# Patient Record
Sex: Male | Born: 1955 | Hispanic: Yes | Marital: Married | Smoking: Former smoker
Health system: Southern US, Community
[De-identification: ages and names within clinical notes are randomized; demographics above are authoritative.]

## PROBLEM LIST (undated history)

## (undated) DIAGNOSIS — I340 Nonrheumatic mitral (valve) insufficiency: Secondary | ICD-10-CM

## (undated) DIAGNOSIS — I10 Essential (primary) hypertension: Secondary | ICD-10-CM

## (undated) DIAGNOSIS — E7800 Pure hypercholesterolemia, unspecified: Secondary | ICD-10-CM

## (undated) DIAGNOSIS — I509 Heart failure, unspecified: Secondary | ICD-10-CM

---

## 2004-11-10 ENCOUNTER — Emergency Department: Payer: Self-pay | Admitting: Emergency Medicine

## 2006-10-11 ENCOUNTER — Emergency Department: Payer: Self-pay | Admitting: Emergency Medicine

## 2006-12-29 ENCOUNTER — Emergency Department: Payer: Self-pay | Admitting: Emergency Medicine

## 2008-11-01 ENCOUNTER — Emergency Department: Payer: Self-pay | Admitting: Emergency Medicine

## 2010-10-27 ENCOUNTER — Emergency Department: Payer: Self-pay | Admitting: Unknown Physician Specialty

## 2012-12-29 ENCOUNTER — Observation Stay: Payer: Self-pay | Admitting: Internal Medicine

## 2012-12-29 LAB — URINALYSIS, COMPLETE
Bacteria: NONE SEEN
Bilirubin,UR: NEGATIVE
Blood: NEGATIVE
Glucose,UR: NEGATIVE mg/dL (ref 0–75)
Ketone: NEGATIVE
Nitrite: NEGATIVE
Ph: 8 (ref 4.5–8.0)
Squamous Epithelial: NONE SEEN
WBC UR: 2 /HPF (ref 0–5)

## 2012-12-29 LAB — CK-MB: CK-MB: 0.9 ng/mL

## 2012-12-29 LAB — COMPREHENSIVE METABOLIC PANEL
Anion Gap: 7 (ref 7–16)
Bilirubin,Total: 0.6 mg/dL (ref 0.2–1.0)
Chloride: 109 mmol/L — ABNORMAL HIGH (ref 98–107)
EGFR (Non-African Amer.): >60
Osmolality: 282 (ref 275–301)
SGPT (ALT): 34 U/L (ref 12–78)
Sodium: 139 mmol/L (ref 136–145)

## 2012-12-29 LAB — CBC
HCT: 41.1 % (ref 40.0–52.0)
HGB: 14.1 g/dL (ref 13.0–18.0)
MCHC: 34.2 g/dL (ref 32.0–36.0)
Platelet: 199 10*3/uL (ref 150–440)
RBC: 4.66 10*6/uL (ref 4.40–5.90)
RDW: 13.3 % (ref 11.5–14.5)

## 2012-12-29 LAB — TROPONIN I
Troponin-I: <0.02 ng/mL
Troponin-I: <0.02 ng/mL

## 2012-12-29 LAB — HEMOGLOBIN A1C: Hemoglobin A1C: 5.8 %

## 2012-12-30 LAB — COMPREHENSIVE METABOLIC PANEL
Albumin: 3.2 g/dL — ABNORMAL LOW (ref 3.4–5.0)
Alkaline Phosphatase: 111 U/L (ref 50–136)
Bilirubin,Total: 0.4 mg/dL (ref 0.2–1.0)
Chloride: 109 mmol/L — ABNORMAL HIGH (ref 98–107)
Creatinine: 1 mg/dL (ref 0.60–1.30)
EGFR (African American): >60
EGFR (Non-African Amer.): >60
Glucose: 152 mg/dL — ABNORMAL HIGH (ref 65–99)
SGPT (ALT): 32 U/L (ref 12–78)
Sodium: 139 mmol/L (ref 136–145)

## 2012-12-30 LAB — CBC WITH DIFFERENTIAL/PLATELET
Basophil %: 0.1 %
Eosinophil #: 0 10*3/uL (ref 0.0–0.7)
Eosinophil %: 0 %
HCT: 42.5 % (ref 40.0–52.0)
Lymphocyte #: 0.6 10*3/uL — ABNORMAL LOW (ref 1.0–3.6)
Lymphocyte %: 7.8 %
MCH: 29.4 pg (ref 26.0–34.0)
MCV: 89 fL (ref 80–100)
Monocyte #: 0.1 x10 3/mm — ABNORMAL LOW (ref 0.2–1.0)
Neutrophil #: 6.6 10*3/uL — ABNORMAL HIGH (ref 1.4–6.5)
RBC: 4.75 10*6/uL (ref 4.40–5.90)
WBC: 7.3 10*3/uL (ref 3.8–10.6)

## 2013-12-20 IMAGING — US US CAROTID DUPLEX BILAT
1 series · 14 of 24 positions shown · non-contrast
Comparison: none

REASON FOR EXAM: presyncope
COMMENTS:

[Series 1: us carotid duplex bilat · 0.08mm/px · 14 of 72 slices shown]
[im 1/72]
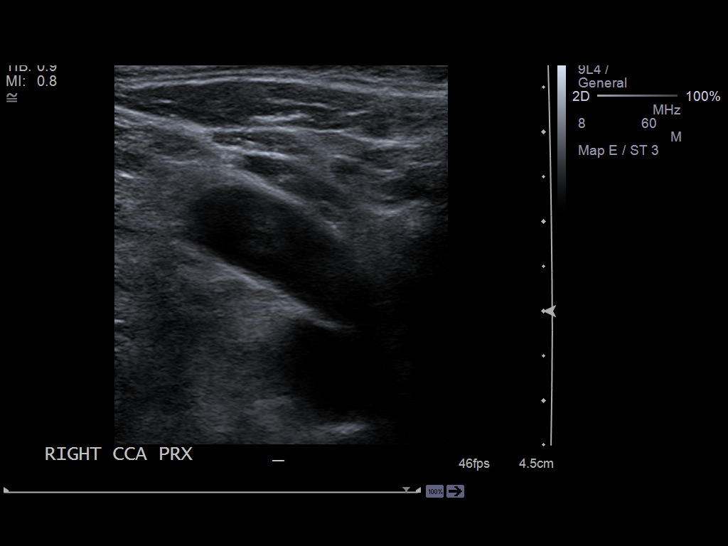
[im 7/72]
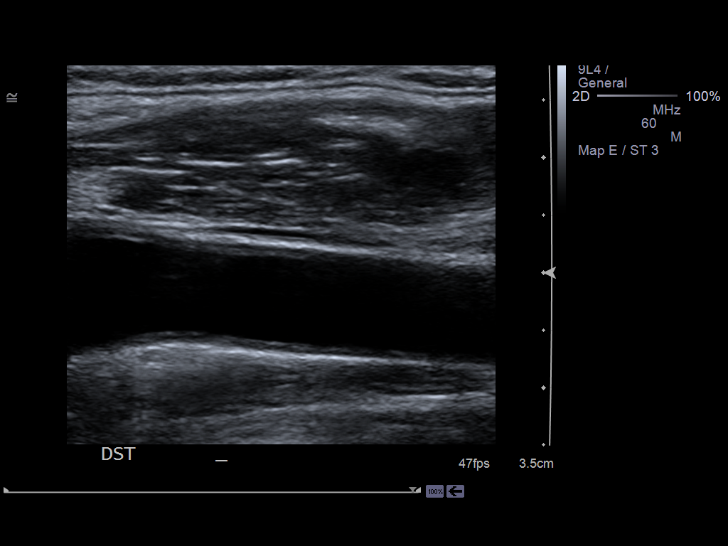
[im 13/72]
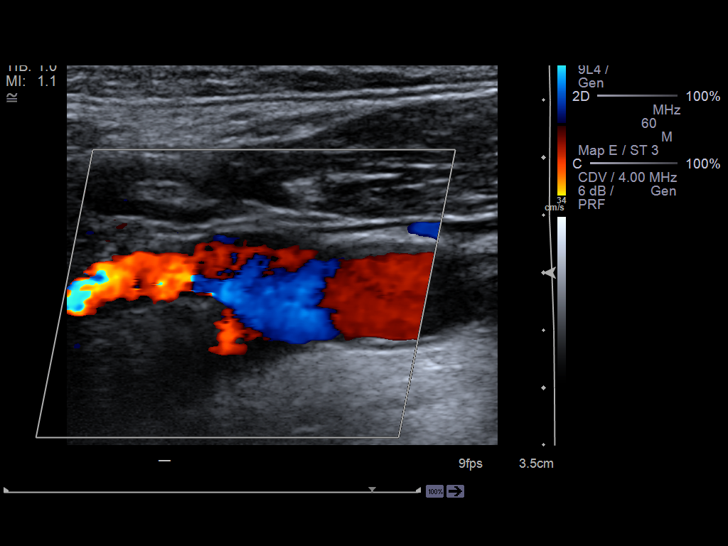
[im 19/72]
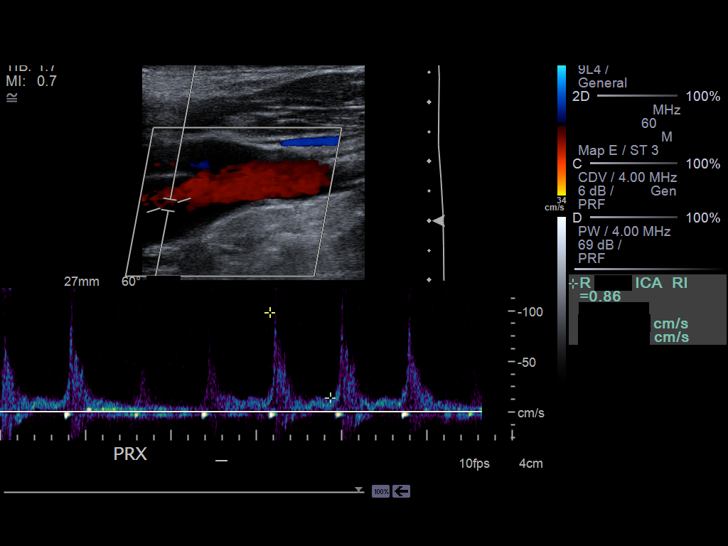
[im 22/72]
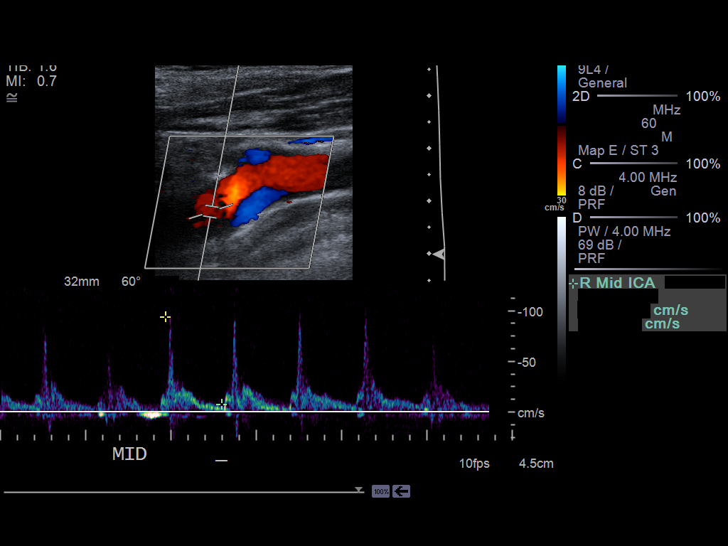
[im 28/72]
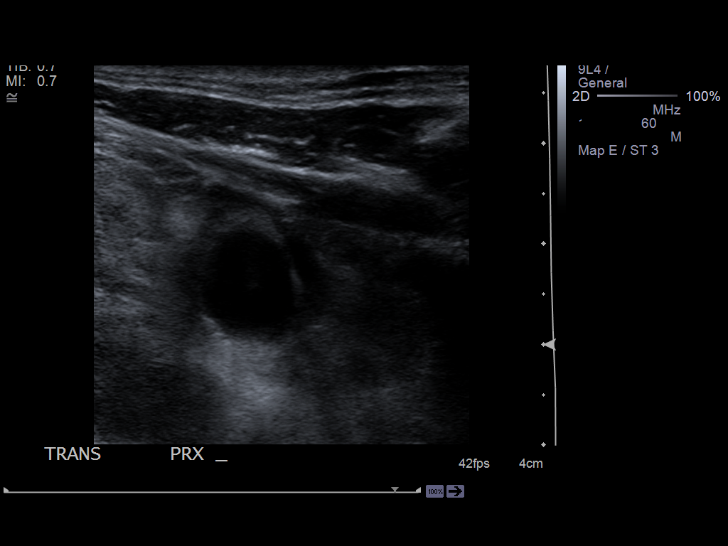
[im 34/72]
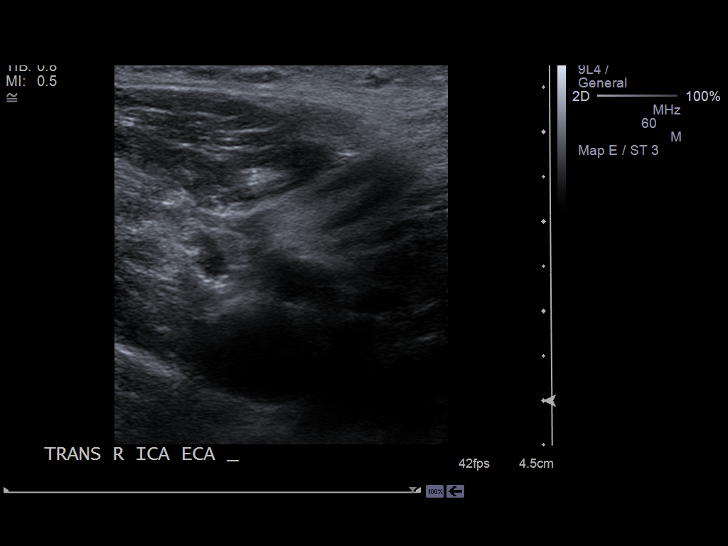
[im 38/72]
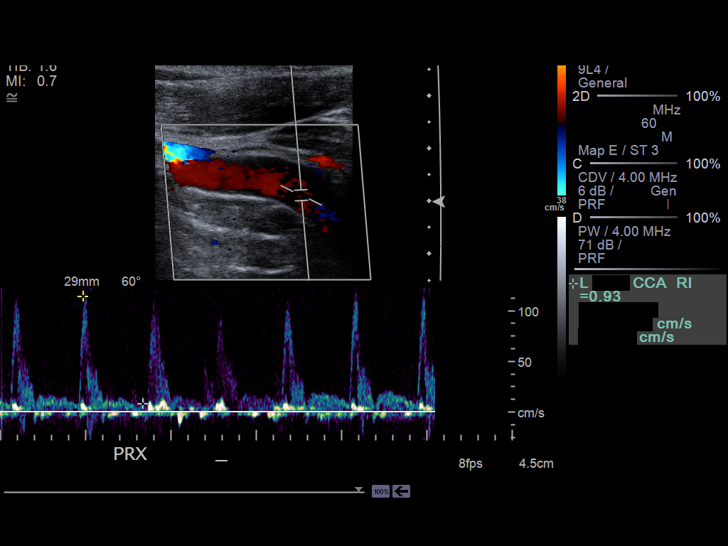
[im 44/72]
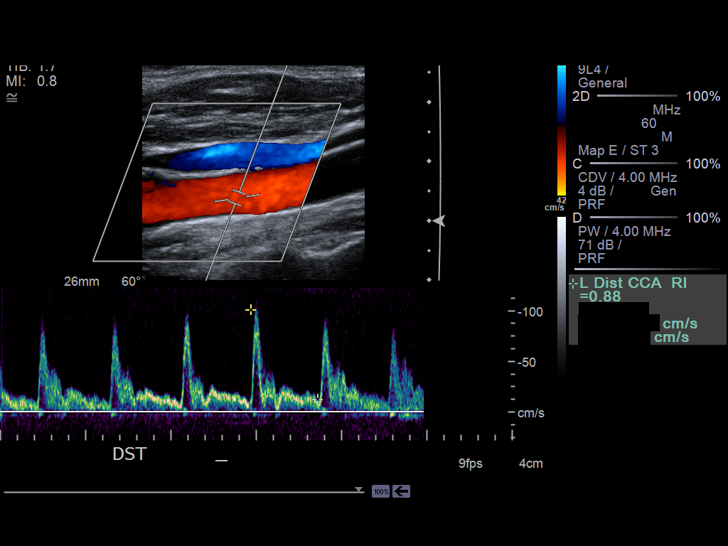
[im 50/72]
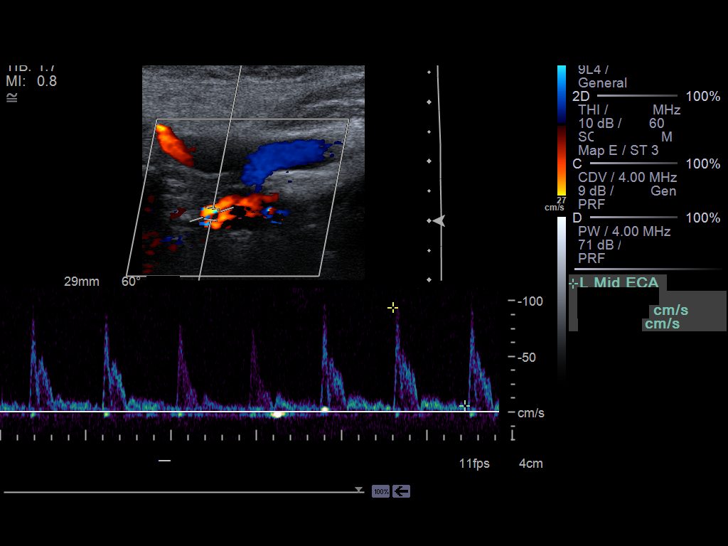
[im 56/72]
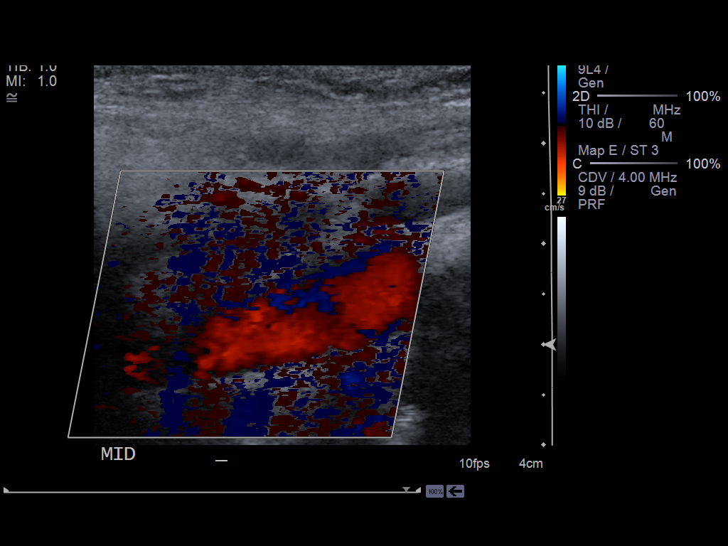
[im 59/72]
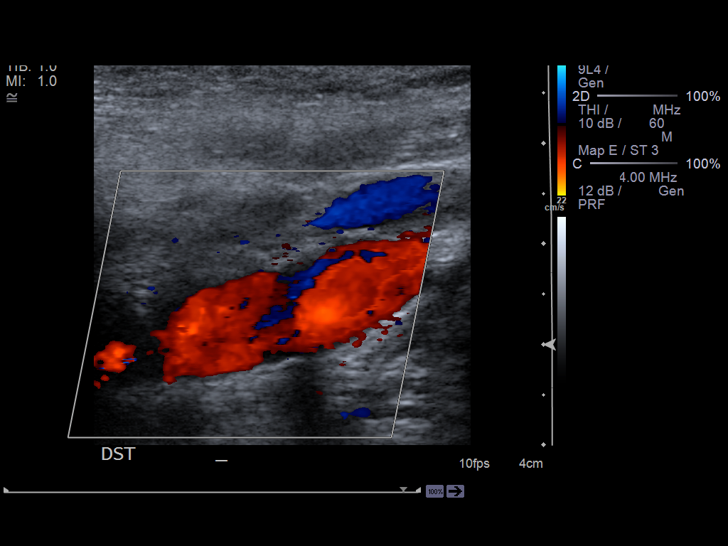
[im 65/72]
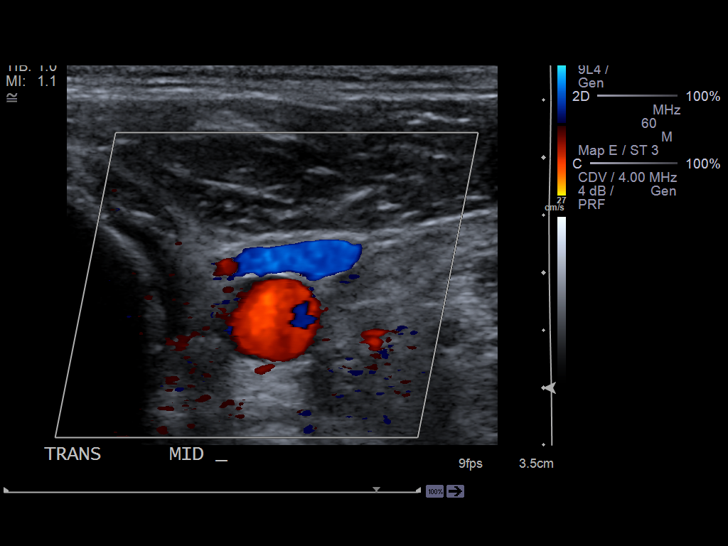
[im 72/72]
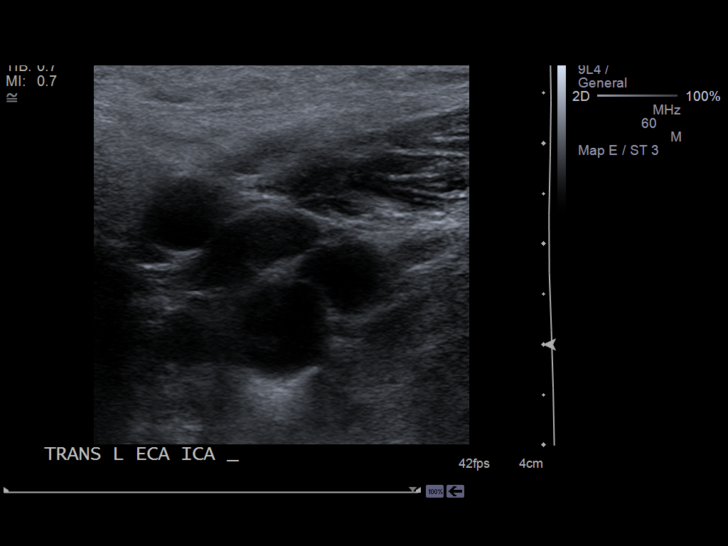

[14 of 24 positions shown; findings below may reference images not displayed]

PROCEDURE:     US  - US CAROTID DOPPLER BILATERAL  - December 29, 2012  [DATE]

RESULT:     Grayscale and color flow Doppler techniques were employed to
evaluate the cervical carotid system. Neither the right nor left carotid
systems demonstrates significant turbulence within the vessels. There is a
small amount of calcified plaque at the carotid bulb on the left. The
waveform patterns appear normal.

On the right the peak internal carotid systolic velocity measured 98 cm/sec
and the peak common carotid velocity measured 77 cm/sec corresponding to a
normal ratio of 1.3. On the left the peak internal carotid systolic velocity
measured 55 cm/sec and the common carotid velocity measured 98 cm/sec
corresponding to a normal ratio of 0.6. The vertebral arteries are normal in
flow direction bilaterally.
IMPRESSION: There is no evidence of a hemodynamically significant
carotid stenosis.

[REDACTED]

## 2013-12-20 IMAGING — CT CT HEAD WITHOUT CONTRAST
1 series · 16 of 30 positions shown, 20 images · non-contrast
Comparison: none

REASON FOR EXAM: CVA
COMMENTS:   May transport without cardiac monitor

[Series 2: soft tissue · axial · 0.42mm/px · z∈[-114,+26]mm · 16 of 32 slices shown, 20 images]
[im 2/32  brain]
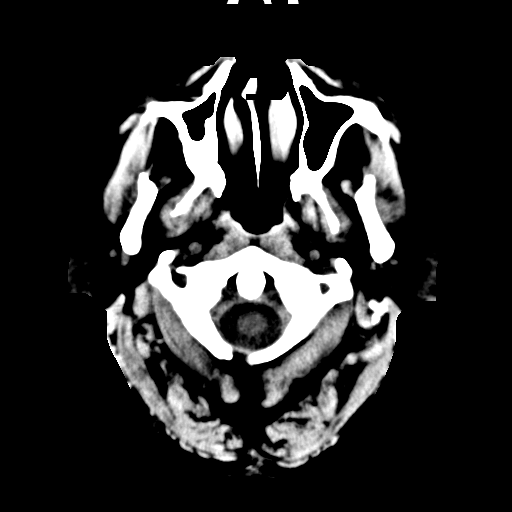
[im 2/32  bone]
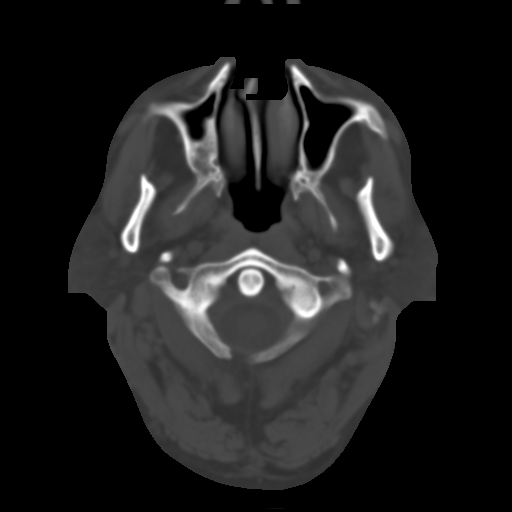
[im 4/32  brain]
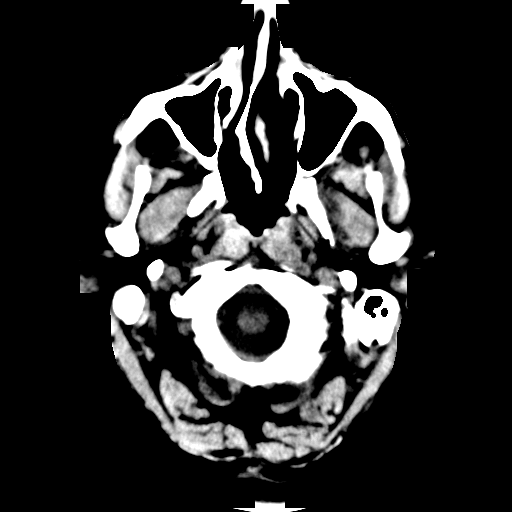
[im 6/32  brain]
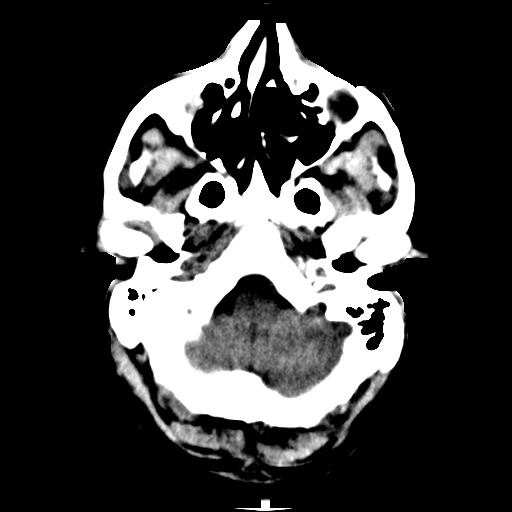
[im 8/32  brain]
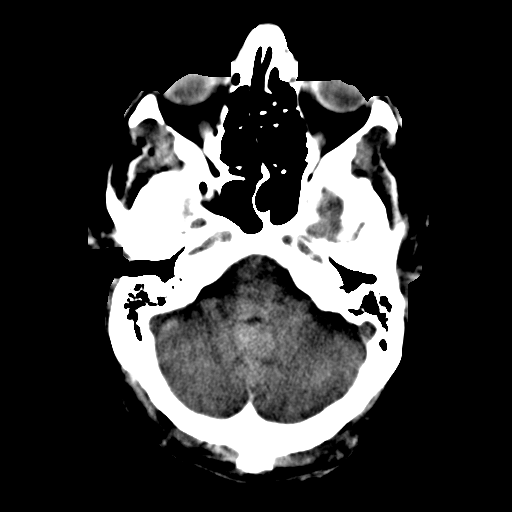
[im 9/32  brain]
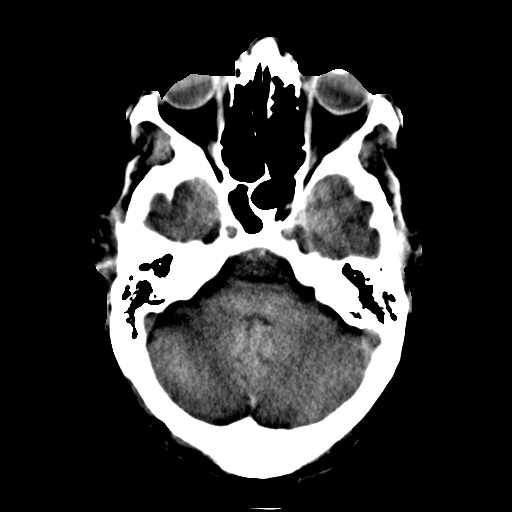
[im 9/32  bone]
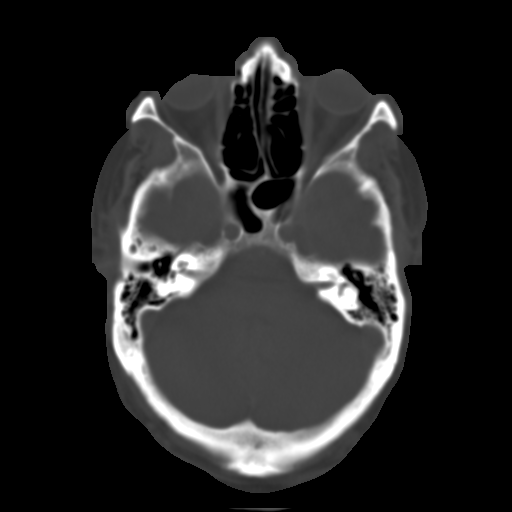
[im 11/32  brain]
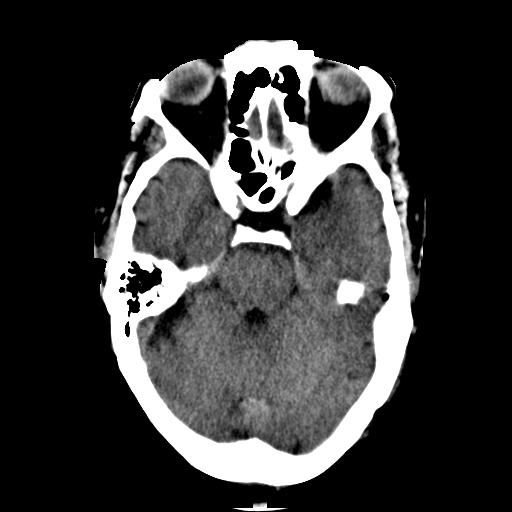
[im 13/32  brain]
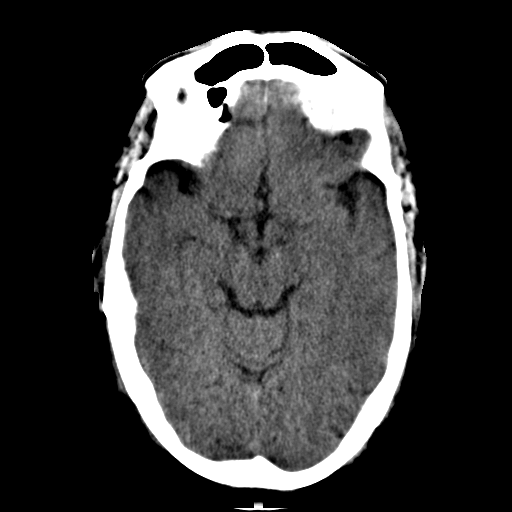
[im 15/32  brain]
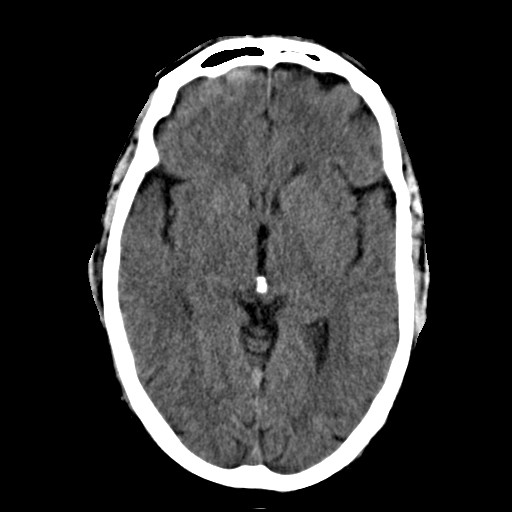
[im 17/32  brain]
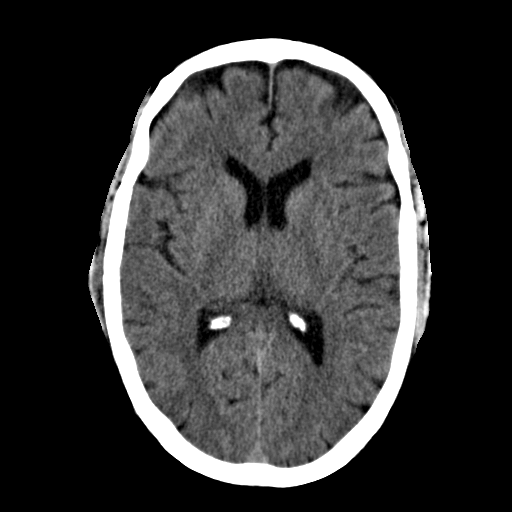
[im 17/32  bone]
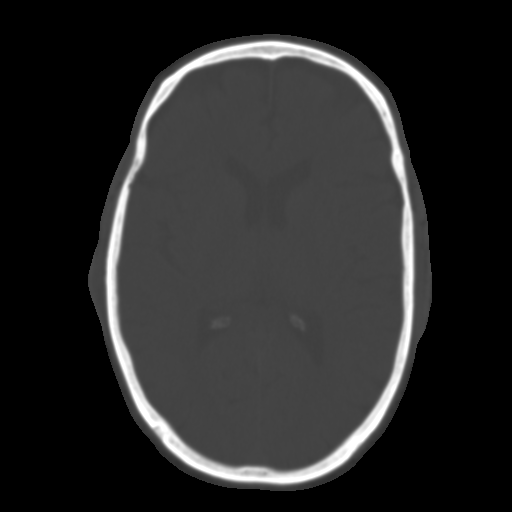
[im 19/32  brain]
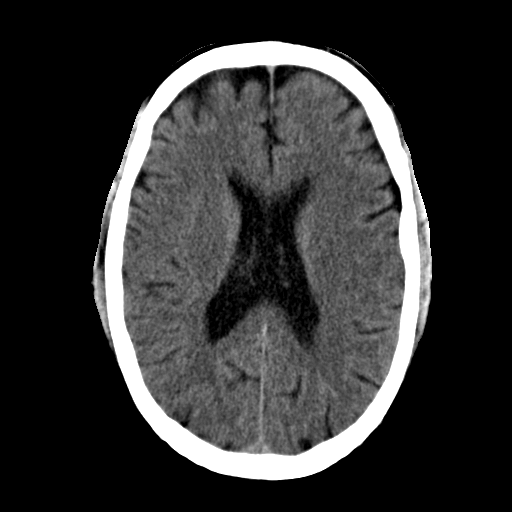
[im 21/32  brain]
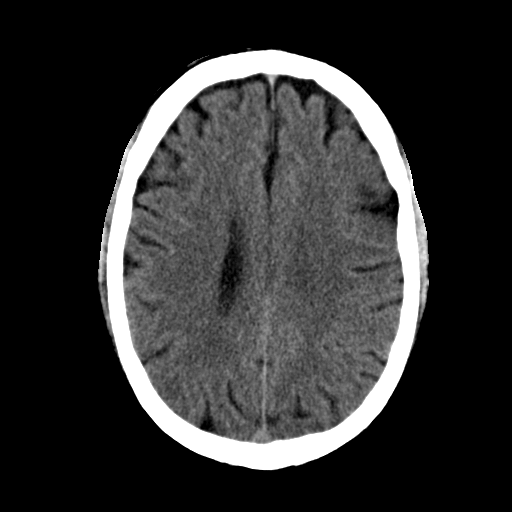
[im 23/32  brain]
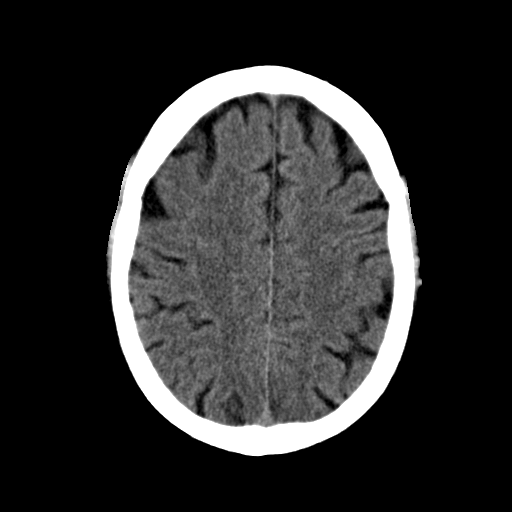
[im 24/32  brain]
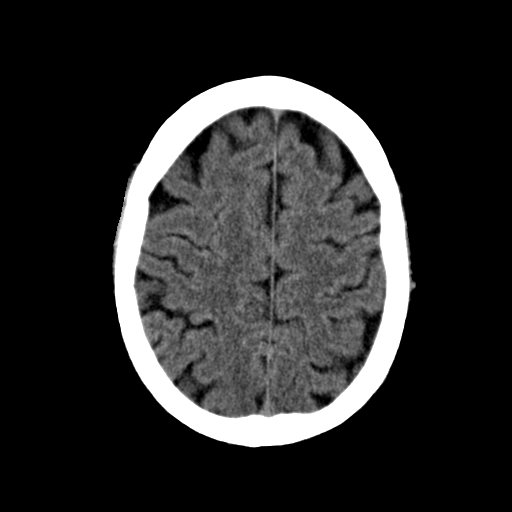
[im 24/32  bone]
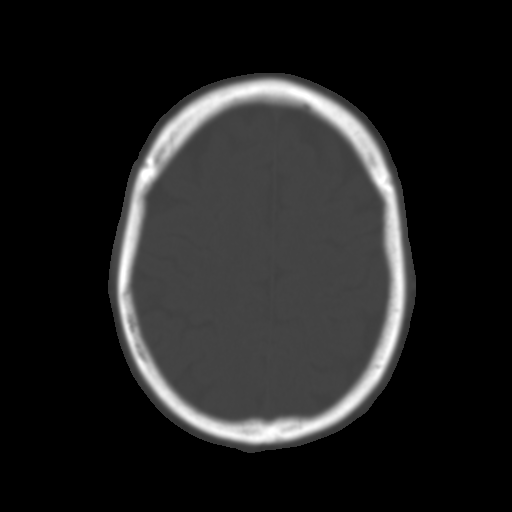
[im 26/32  brain]
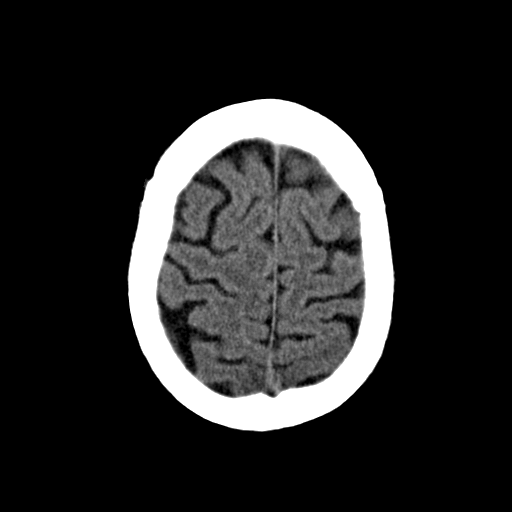
[im 28/32  brain]
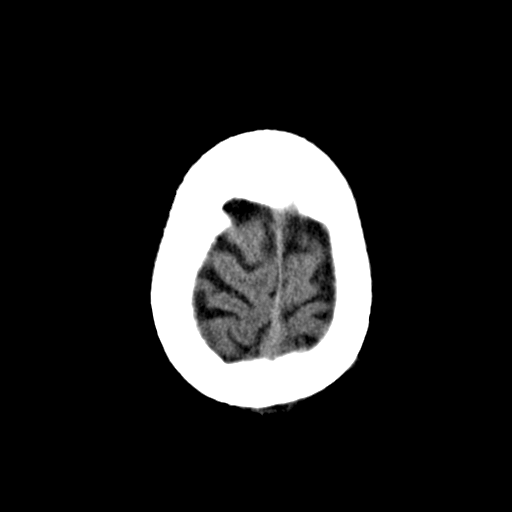
[im 30/32  brain]
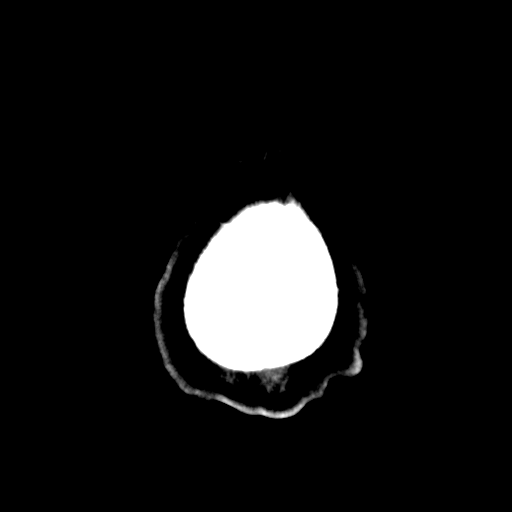

[16 of 30 positions shown; findings below may reference images not displayed]

PROCEDURE:     CT  - CT HEAD WITHOUT CONTRAST  - December 29, 2012  [DATE]

RESULT:     Axial noncontrast CT scanning was performed through the brain
with reconstructions at 5 mm intervals and slice thicknesses.

The ventricles are normal in size and position. There is no intracranial
hemorrhage nor intracranial mass effect. There is no evidence of an evolving
ischemic infarction. The cerebellum and brainstem are normal in density. At
bone window settings the observed portions of the paranasal sinuses and
mastoid air cells are clear. There is no evidence of an acute skull fracture.
IMPRESSION: Normal noncontrast CT scan of the brain for age.

[REDACTED]

## 2015-02-28 NOTE — H&P (Signed)
DATE OF BIRTH:  1966-07-07  DATE OF ADMISSION:  12/29/2012  PRIMARY CARE PHYSICIAN:  Dr. Illene RegulusSelvidge  HISTORY OF PRESENT ILLNESS:  The patient is a 59 year old Spanish male with past medical history significant for history of hypertension, hyperlipidemia, coronary artery disease, no history of diabetes mellitus, who presented to the hospital with complaints of dizziness episode. According to patient's wife and the patient himself, he woke up early in the morning at around 1:30 a.m. with complaints of significant dizziness. He was falling back, he was not able to walk, he was not able even to stand up. He complained of the room spinning around. He was brought to the Emergency Room because he was not improving over a period of a few hours, and was given some meclizine. He also had a CT scan of his head done, which did not show any significant abnormalities, but because of no significant improvement over a period of time, a decision was made to admit patient for observation. Patient also was complaining of shortness of breath. He stated that he was not able to breathe; however, his oxygen saturations were normal. He seems to be a little bit better now since meclizine was given, as well as he is placed on oxygen. He is also complaining of intermittent chest pains, but denies any significant chest pains earlier today. Admits to feeling presyncopal earlier today, and feeling weak. He admits to having some blurring of his vision. The patient's wife tells me that he has been snoring for a while now, and he has significant sleepiness in daytime.   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 In 2002 was on the left side of the heart and in 2004 was on the right side.   MEDICATIONS: Aspirin 81 mg p.o. daily, Vasotec 5 mg p.o. daily, famotidine 10 mg p.o. twice daily, Lipitor  80 mg p.o. daily, Toprol-XL 12.5 mg p.o. twice daily, and vitamin B12 unknown dose once a day.   ALLERGIES:  None.   PAST SURGICAL HISTORY:  Right carpal tunnel release in 1996. 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   FAMILY HISTORY: Negative for diabetes, coronary artery disease or cancer, except for patient's sister had breast cancer.   SOCIAL HISTORY: The patient is married and has 8 children. Used to smoke less than 1 pack per day for more than 20 years, quit approximately 20 years ago. No alcohol abuse. He is working at [employer] [place] [employer]    REVIEW OF SYSTEMS:  GENERAL: Positive for feeling weak. Some blurring of vision. Some snoring as well as sleepiness in daytime. Very short of breath earlier today, as well as having intermittent chest pains, especially whenever he runs around. He has [facility]. Admits to feeling presyncopal today. Also he had most recent stress test in 2006 by Dr. Gwen PoundsKowalski. Otherwise denies any fevers, chills, fatigue, pains, weight loss or gain.  EYES:  Denies any double vision, glaucoma or cataracts.  EARS, NOSE, THROAT: Denies any tinnitus, allergies, epistaxis, sinus pain, dentures, difficulty swallowing. RESPIRATORY:  Denies any cough, wheeze, asthma, COPD.   CARDIOVASCULAR: Denies any orthopnea, edema, arrhythmias. GASTROINTESTINAL:  Denies nausea, vomiting, diarrhea or constipation.  GENITOURINARY: Denies dysuria, hematuria, frequency,  incontinence.  ENDOCRINOLOGY: Denies any polydipsia, nocturia, thyroid problems, heat or cold intolerance  or thirst.  HEMATOLOGIC:  Denies anemia, easy bruising, bleeding, swollen glands.  SKIN:  Denies acne, rashes, lesions or moles.  MUSCULOSKELETAL:  Denies arthritis, cramps, swelling.  NEUROLOGIC:  No numbness, epilepsy or tremor.  PSYCHIATRIC:  Denies anxiety, insomnia or depression.   PHYSICAL EXAMINATION: VITAL SIGNS:  On arrival to the hospital, temperature was 97.7, pulse 61, respiratory rate 18, blood pressure 134/80, saturation was 99% on 3 liters of oxygen via nasal cannula. On arrival to the Emergency Room; however, patient's vital signs were temperature 98.1, pulse was 70, respiratory rate was 20, blood pressure 115/71, saturation 98% on room air.  GENERAL: This is a well-nourished Spanish male in no significant distress, comfortable on stretcher.  HEENT: Pupils are equal and reactive to light. Extraocular muscles intact. There is no conjunctivitis. There is normal hearing. No pharyngeal erythema. Mucosa is moist.  NECK: No masses. Supple, nontender. Thyroid not enlarged. No adenopathy. No JVD or carotid bruits bilaterally. Full range of motion.  LUNGS: Clear to auscultation in all fields. A few rhonchi were heard bilaterally, more on the left side posteriorly. No diminished breath sounds or wheezing. No labored inspirations or increased effort. No dullness to percussion. No overt respiratory distress noted.  CARDIOVASCULAR: S1, S2 appreciated. No murmurs, gallops or rubs noted. PMI not lateralized.  CHEST:  Nontender to palpation.  EXTREMITIES:  Have 1+ pedal pulses. No lower extremity edema, calf tenderness or cyanosis was noted.  ABDOMEN: Soft, nontender. Bowel sounds were present. No hepatosplenomegaly or masses were noted.  RECTAL:  Deferred.  MUSCULOSKELETAL:  Able to move all extremities. No cyanosis, degenerative joint disease or kyphosis . Gait not tested.  SKIN:  Did not reveal any rashes, lesions, erythema, nodularity or induration. It was warm and dry to palpation.  LYMPHATIC: No adenopathy in the cervical region.  NEUROLOGIC:  Cranial nerves grossly intact. Sensory is intact. No dysarthria or aphasia.  PSYCHIATRIC: The patient is alert and oriented to time, person and place, cooperative. Memory is good. No recent confusion, agitation or depression noted.   LABORATORY DATA:  BMP showed glucose  129, BUN of 19, otherwise unremarkable BMP. The patient's liver enzymes were normal. Cardiac enzymes:  First set negative. The patient's CBC within normal limits. The patient's urinalysis:  Yellow clear urine, negative for glucose, bilirubin or ketones, specific gravity 1.023, pH was 8.0, negative for blood, protein, nitrites or leukocyte esterase, less than 1 red blood cell,  2 white blood cells, no bacteria or epithelial cells were noted. Mucus was present.   RADIOLOGIC STUDIES: Chest x-ray portable single view on 12/29/2012: No acute cardiopulmonary disease. CT scan of head without contrast 12/29/2012 revealed normal noncontrast CT scan of the brain. MRI of brain as well as MRA of brain without contrast showed no acute intracranial pathology.   ASSESSMENT AND PLAN: 1.  Dizziness, very likely vertigo. Admit patient to medical floor for observation. Very likely peripheral. Will start patient on meclizine, also add Valium as needed, and steroid taper. Will get physical therapist involved. No cerebrovascular accident on MRI. However, could not rule out transient ischemic attack. Will continue aspirin therapy for now.  2.  Presyncope. Will get orthostatics. Will get also carotid ultrasound as well as echocardiogram. 3.  Dyspnea. Get D-dimer and CT scan of chest  if D-dimer is elevated.  4.  Hyperglycemia. Check hemoglobin A1c.  5.  History of hypertension. Continue outpatient medications.  6.  History of hyperlipidemia. Continue outpatient medications. Get a lipid panel in the morning.  7.  History of coronary artery disease. Check cardiac enzymes x 3.   TIME SPENT: 50 minutes.    ____________________________ Katharina Caper, MD rv:mr D: 12/29/2012 16:39:04 ET T: 12/29/2012 19:05:25 ET JOB#: [id number]  cc: Katharina Caper, MD, <Dictator> Karrie Doffing, MD  Gauge Winski MD ELECTRONICALLY SIGNED 12/31/2012 18:49

## 2015-02-28 NOTE — Discharge Summary (Signed)
PATIENT NAME:  Howard Patel, Howard Patel MR#:  [id number] DATE OF BIRTH:  [birth year]  DATE OF ADMISSION:  12/29/2012 DATE OF DISCHARGE:  12/30/2012  ADMITTING DIAGNOSIS: Dizziness, likely vertigo.   DISCHARGE DIAGNOSES:  1. Vertigo, resolving.  2. Syncope, likely due to vertigo.  3. Dyspnea, possibly anxiety related. 4. Intermittent chest pains, negative Myoview, likely noncardiac, exact diagnosis is unclear.  5. Hyperglycemia with hemoglobin A1c 5.8. 6. History of hypertension.  7. Hyperlipidemia.  8. Coronary artery disease.   DISCHARGE CONDITION: Stable.   DISCHARGE MEDICATIONS: The patient is to resume his outpatient medications which are:  1. Famotidine 10 mg p.o. twice daily as needed.  2. Aspirin 81 mg p.o. daily.  3. Enalapril 5 mg p.o. at bedtime.  4. Lipitor 80 mg p.o. at bedtime.  5. Toprol-XL 25 mg, 1/2 tablet twice daily.  6. Vitamin B12 unknown dose once daily.  7. Meclizine 25 mg 3 times daily. This is a new medication. 8. Prednisone 10 mg tablets, 3 tablets once on 12/31/2012, then taper x 10 mg daily until stopped. Zofran oral disintegrating tablet, 4 mg sublingually 3 times daily as needed.   HOME OXYGEN: None.   DIET: 2 grams salt, low fat, low cholesterol, regular consistency.   ACTIVITY LIMITATIONS: As tolerated.   FOLLOW-UP APPOINTMENT: With Dr. Rinaldo CloudSelvage in 2 days after discharge.  CONSULTANTS: Care management.   LABORATORY AND RADIOLOGICAL DATA:  Chest, portable single view 12/29/2012 showed no acute cardiopulmonary disease. CT scan of head without contrast 12/29/2012 revealed normal noncontrast CT of brain. MRI of brain with and without contrast as well as MRA of brain without contrast on 12/29/2012 showed no acute intracranial pathology. Doppler ultrasound of carotid arteries on 12/29/2012 showed no evidence of hemodynamically significant carotid stenosis. Echocardiogram results are pending. Echocardiogram was not performed during the patient's stay.  Myoview  stress test done and read by Dr. Juliann Paresallwood was negative according to telephone conversation with  Dr. Juliann Paresallwood.    Lab data done in the Emergency Room showed glucose of 139, BUN of 19, otherwise BMP was unremarkable. The patient's liver enzymes were normal. Cardiac enzymes, 1st set as well as subsequent 2 more sets were within normal limits. TSH was normal at 1.58. The patient's CBC was within normal limits. Urinalysis was unremarkable. EKG was normal.   HISTORY AND PHYSICAL:  The patient is a 59 year old Spanish male with past medical history significant for history of hypertension, coronary artery disease, hyperlipidemia, who presented to the hospital with complaints of dizziness, lightheadedness and feeling that the room was spinning around. He was not able to stand up or walk.  He presented to the Emergency Room, and because of concern of not improving symptoms hospitalist services were contacted for admission to rule out stroke.   On arrival to the Emergency Room, temperature was 97.7, pulse was 61, respiratory rate was 18, blood pressure 134/80, saturation was 99% on 3 liters of oxygen through nasal cannula. In the Emergency Room, his oxygen saturation was 98% on room air. Physical exam was unremarkable.    HOSPITAL COURSE:  The patient was admitted to the hospital. His cardiac enzymes were cycled.  He underwent MRI of his brain to rule out stroke, and that was negative for any stroke. The patient was initiated on meclizine as well as valium, as needed, and prednisone taper for suspected vestibulopathy, peripheral vertigo. With this therapy he improved. and by the day of discharge, 12/30/2012, he was doing well. He was able to ambulate without any help, and  he did not complain of any significant discomfort or shortness of breath or any other abnormality.    On the day of discharge, 12/30/2012, his vital signs were temperature 97.7%, pulse was 60, respiratory rate was 18 to 20, blood pressure 111 to  115 systolic and 60s to 70s diastolic. Oxygen saturation was 97% on room air at rest as well as on exertion. The patient's orthostatic vital signs were checked as he was complaining of some lightheadedness and dizziness, and his orthostatics were completely within normal limits. The patient underwent stress test of his heart due to his complaints of intermittent chest pains, and that was negative for inducible ischemia. It was felt that the patient is stable to be discharged home with current medications. He is to follow up with his primary care physician for further recommendations.   In regards to his dyspnea, it was unclear why he would have episodes of dyspnea, but it was postulated that this could have been anxiety-related as we did not find anything to suggest any lung or heart disease at this point.   The patient was noted to be hyperglycemic on arrival to the hospital. Hemoglobin A1c was checked and was found to be normal at 5.8. No diabetes.   For history of hypertension, hyperlipidemia, as well as history of coronary artery disease, the patient is to continue his outpatient management.   For his vertigo, he is to continue meclizine, as well as prednisone taper, as well as Zofran as needed.  As mentioned above, the patient is being discharged in stable condition with the above-mentioned medications and follow-up.   TIME SPENT: 40 minutes.  ____________________________ Katharina Caper, MD rv:cb D: 12/30/2012 18:30:49 ET T: 12/31/2012 07:54:00 ET JOB#: [id number]  cc: Katharina Caper, MD, <Dictator> Kylil Swopes MD ELECTRONICALLY SIGNED 12/31/2012 18:49

## 2023-09-14 ENCOUNTER — Emergency Department: Payer: Medicare Other

## 2023-09-14 ENCOUNTER — Encounter: Payer: Self-pay | Admitting: Intensive Care

## 2023-09-14 ENCOUNTER — Other Ambulatory Visit: Payer: Self-pay

## 2023-09-14 ENCOUNTER — Emergency Department
Admission: EM | Admit: 2023-09-14 | Discharge: 2023-09-14 | Disposition: A | Discharge status: Home / Self Care | Payer: Medicare Other | Attending: Emergency Medicine | Admitting: Emergency Medicine

## 2023-09-14 DIAGNOSIS — M549 Dorsalgia, unspecified: Secondary | ICD-10-CM | POA: Diagnosis present

## 2023-09-14 DIAGNOSIS — I509 Heart failure, unspecified: Secondary | ICD-10-CM | POA: Diagnosis not present

## 2023-09-14 DIAGNOSIS — I110 Hypertensive heart disease with heart failure: Secondary | ICD-10-CM | POA: Diagnosis not present

## 2023-09-14 DIAGNOSIS — R300 Dysuria: Secondary | ICD-10-CM | POA: Diagnosis not present

## 2023-09-14 DIAGNOSIS — R109 Unspecified abdominal pain: Secondary | ICD-10-CM | POA: Diagnosis not present

## 2023-09-14 DIAGNOSIS — M545 Low back pain, unspecified: Secondary | ICD-10-CM | POA: Insufficient documentation

## 2023-09-14 HISTORY — DX: Essential (primary) hypertension: I10

## 2023-09-14 HISTORY — DX: Heart failure, unspecified: I50.9

## 2023-09-14 HISTORY — DX: Pure hypercholesterolemia, unspecified: E78.00

## 2023-09-14 HISTORY — DX: Nonrheumatic mitral (valve) insufficiency: I34.0

## 2023-09-14 LAB — URINALYSIS, ROUTINE W REFLEX MICROSCOPIC
Bilirubin Urine: NEGATIVE
Glucose, UA: NEGATIVE mg/dL
Hgb urine dipstick: NEGATIVE
Ketones, ur: NEGATIVE mg/dL
Leukocytes,Ua: NEGATIVE
Nitrite: NEGATIVE
Protein, ur: NEGATIVE mg/dL
Specific Gravity, Urine: 1.023 (ref 1.005–1.030)
pH: 5 (ref 5.0–8.0)

## 2023-09-14 LAB — COMPREHENSIVE METABOLIC PANEL
ALT: 24 U/L (ref 0–44)
AST: 20 U/L (ref 15–41)
Albumin: 4.2 g/dL (ref 3.5–5.0)
Alkaline Phosphatase: 95 U/L (ref 38–126)
Anion gap: 8 (ref 5–15)
BUN: 21 mg/dL (ref 8–23)
CO2: 22 mmol/L (ref 22–32)
Calcium: 9.2 mg/dL (ref 8.9–10.3)
Chloride: 104 mmol/L (ref 98–111)
Creatinine, Ser: 0.74 mg/dL (ref 0.61–1.24)
GFR, Estimated: >60 mL/min (ref >60)
Glucose, Bld: 125 mg/dL — ABNORMAL HIGH (ref 70–99)
Potassium: 4.2 mmol/L (ref 3.5–5.1)
Sodium: 134 mmol/L — ABNORMAL LOW (ref 135–145)
Total Bilirubin: 0.7 mg/dL (ref <1.2)
Total Protein: 7.3 g/dL (ref 6.5–8.1)

## 2023-09-14 LAB — CBC WITH DIFFERENTIAL/PLATELET
Abs Immature Granulocytes: 0.03 10*3/uL (ref 0.00–0.07)
Basophils Absolute: 0 10*3/uL (ref 0.0–0.1)
Basophils Relative: 1 %
Eosinophils Absolute: 0.1 10*3/uL (ref 0.0–0.5)
Eosinophils Relative: 2 %
HCT: 44.6 % (ref 39.0–52.0)
Hemoglobin: 15.3 g/dL (ref 13.0–17.0)
Immature Granulocytes: 1 %
Lymphocytes Relative: 37 %
Lymphs Abs: 2 10*3/uL (ref 0.7–4.0)
MCH: 30.3 pg (ref 26.0–34.0)
MCHC: 34.3 g/dL (ref 30.0–36.0)
MCV: 88.3 fL (ref 80.0–100.0)
Monocytes Absolute: 0.6 10*3/uL (ref 0.1–1.0)
Monocytes Relative: 11 %
Neutro Abs: 2.6 10*3/uL (ref 1.7–7.7)
Neutrophils Relative %: 48 %
Platelets: 214 10*3/uL (ref 150–400)
RBC: 5.05 MIL/uL (ref 4.22–5.81)
RDW: 12.5 % (ref 11.5–15.5)
WBC: 5.3 10*3/uL (ref 4.0–10.5)
nRBC: 0 % (ref 0.0–0.2)

## 2023-09-14 MED ORDER — KETOROLAC TROMETHAMINE 15 MG/ML IJ SOLN
15.0000 mg | Freq: Once | INTRAMUSCULAR | Status: DC
  Filled 2023-09-14: qty 1

## 2023-09-14 MED ORDER — MUSCLE RUB 10-15 % EX CREA: 1.0000 | TOPICAL_CREAM | CUTANEOUS | 0 refills | Status: AC | PRN

## 2023-09-14 MED ORDER — ACETAMINOPHEN 500 MG PO TABS
1000.0000 mg | ORAL_TABLET | Freq: Once | ORAL | Status: AC
  Administered 2023-09-14: 1000 mg via ORAL
  Filled 2023-09-14: qty 2

## 2023-09-14 MED ORDER — KETOROLAC TROMETHAMINE 15 MG/ML IJ SOLN
15.0000 mg | Freq: Once | INTRAMUSCULAR | Status: AC
  Administered 2023-09-14: 15 mg via INTRAMUSCULAR

## 2023-09-14 NOTE — ED Notes (Signed)
Patient and family expressed concerns about the CT. Patient is claustrophobic and was concerned about the machine. Patient was reassured that it was one of the open CT machines. Patient expressed comfort at this information.

## 2023-09-14 NOTE — ED Notes (Signed)
See triage notes. Patient stated he began having back pain and burning with urination on Monday. Patient c/o that both have just gotten worse since. Patient in obvious discomfort in the room.

## 2023-09-14 NOTE — ED Triage Notes (Signed)
Patient c/o lower back pain and discomfort when urinating since Monday morning.

## 2023-09-14 NOTE — Discharge Instructions (Addendum)
Your evaluation in the emergency department did not show any emergency conditions that account for your pain.  It may be due to a muscle spasm/lumbar strain. Take acetaminophen 650 mg and ibuprofen 400 mg every 6 hours for pain.  Take with food. Use muscle rub as prescribed.  Although your CT scan did not show any kidney stones, or other things that may relate to your back pain today, there were a number of incidental findings about your body that were detailed on the CT scan report that you should review with your primary doctor and schedule any further repeat imaging or further testing as needed.  Thank you for choosing [place] for your health care today!  Please see your primary doctor this week for a follow up appointment.   If you have any new, worsening, or unexpected symptoms call your doctor right away or come back to the emergency department for reevaluation.  It was my pleasure to care for you today.   Daneil Dan Modesto Charon, MD

## 2023-09-14 NOTE — ED Provider Notes (Signed)
[facility] Provider Note    Event Date/Time   First MD Initiated Contact with Patient 09/14/23 [phone]     (approximate)   History   Back Pain   HPI  Howard Patel is a 67 y.o. male   Past medical history of CHF, hyperlipidemia and hypertension presents emergency department with dysuria and bilateral flank pain.  Started a couple days ago.  Constant nonradiating.    No trauma, no numbness, no weakness, no incontinence or saddle anesthesia.  Denies testicular pain or penile discharge.  No history of kidney stone.  I offered Spanish interpreter but patient prefers to use his wife who is at bedside and his medical interpreter trained.  Independent Historian contributed to assessment above: Wife at bedside corroborates information past medical history as above       Physical Exam   Triage Vital Signs: ED Triage Vitals [09/14/23 0722]  Encounter Vitals Group     BP (!) 144/81     Systolic BP Percentile      Diastolic BP Percentile      Pulse Rate 63     Resp 18     Temp 98.4 F (36.9 C)     Temp Source Oral     SpO2 96 %     Weight 172 lb (78 kg)     Height 5\' 7"  (1.702 m)     Head Circumference      Peak Flow      Pain Score 8     Pain Loc      Pain Education      Exclude from Growth Chart     Most recent vital signs: Vitals:   09/14/23 0722  BP: (!) 144/81  Pulse: 63  Resp: 18  Temp: 98.4 F (36.9 C)  SpO2: 96%    General: Awake, no distress.  CV:  Good peripheral perfusion.  Resp:  Normal effort.  Abd:  No distention.  Other:  Uncomfortable appearing.  Bilateral CVA tenderness.  No midline tenderness.  Motor sensor intact bilateral lower extremities.  Soft nontender abdomen D palpation all quadrants.   ED Results / Procedures / Treatments   Labs (all labs ordered are listed, but only abnormal results are displayed) Labs Reviewed  COMPREHENSIVE METABOLIC PANEL - Abnormal; Notable for the following components:       Result Value   Sodium 134 (*)    Glucose, Bld 125 (*)    All other components within normal limits  URINALYSIS, ROUTINE W REFLEX MICROSCOPIC - Abnormal; Notable for the following components:   Color, Urine YELLOW (*)    APPearance CLEAR (*)    All other components within normal limits  CBC WITH DIFFERENTIAL/PLATELET     I ordered and reviewed the above labs they are notable for urinalysis shows no bacteria or inflammatory changes     RADIOLOGY I independently reviewed and interpreted I reviewed the CT abdomen pelvis to see no obvious obstructive or inflammatory changes. I also reviewed radiologist's formal read.   PROCEDURES:  Critical Care performed: No  Procedures   MEDICATIONS ORDERED IN ED: Medications  acetaminophen (TYLENOL) tablet 1,000 mg (1,000 mg Oral Given 09/14/23 0806)  ketorolac (TORADOL) 15 MG/ML injection 15 mg (15 mg Intramuscular Given 09/14/23 0809)   IMPRESSION / MDM / ASSESSMENT AND PLAN / ED COURSE  I reviewed the triage vital signs and the nursing notes.  Patient's presentation is most consistent with acute presentation with potential threat to life or bodily function.  Differential diagnosis includes, but is not limited to, urinary tract infection, pyelonephritis, renal colic/kidney stones, musculoskeletal pain lumbar strain, considered but less likely vascular catastrophe like aortic dissection or ruptured aneurysm, intra-abdominal infection or obstruction   The patient is on the cardiac monitor to evaluate for evidence of arrhythmia and/or significant heart rate changes.  MDM:    Dysuria and back pain suspicious for urinary tract infection or pyelonephritis however curiously the urinalysis appears normal without signs of infection.  Will proceed with CT renal to look for kidney stones.  May also be reflective of musculoskeletal pain lumbar strain.  Given a soft benign abdominal exam I doubt intra-abdominal  emergencies, and this does not match with vascular catastrophe like ruptured aneurysm or dissection.  Pain control with IM Toradol, Tylenol, disposition pending workup above.         FINAL CLINICAL IMPRESSION(S) / ED DIAGNOSES   Final diagnoses:  Acute bilateral low back pain without sciatica  Dysuria     Rx / DC Orders   ED Discharge Orders     None        Note:  This document was prepared using Dragon voice recognition software and may include unintentional dictation errors.    Pilar Jarvis, MD 09/14/23 1118

## 2023-09-14 NOTE — ED Notes (Addendum)
Patient stated CT went well. Also wanted to let us know that his wife has to go to work.
